# Patient Record
Sex: Female | Born: 1965 | Race: Black or African American | Hispanic: No | Smoking: Never smoker
Health system: Southern US, Community
[De-identification: ages and names within clinical notes are randomized; demographics above are authoritative.]

## PROBLEM LIST (undated history)

## (undated) DIAGNOSIS — Z8739 Personal history of other diseases of the musculoskeletal system and connective tissue: Secondary | ICD-10-CM

## (undated) HISTORY — PX: BACK SURGERY: SHX140

## (undated) HISTORY — PX: ABDOMINAL HYSTERECTOMY: SHX81

---

## 2002-09-04 ENCOUNTER — Emergency Department (HOSPITAL_COMMUNITY): Admission: EM | Admit: 2002-09-04 | Discharge: 2002-09-04 | Payer: Self-pay | Admitting: Emergency Medicine

## 2002-09-04 ENCOUNTER — Encounter: Payer: Self-pay | Admitting: Emergency Medicine

## 2002-11-02 ENCOUNTER — Emergency Department (HOSPITAL_COMMUNITY): Admission: EM | Admit: 2002-11-02 | Discharge: 2002-11-02 | Payer: Self-pay | Admitting: Emergency Medicine

## 2003-02-12 ENCOUNTER — Observation Stay (HOSPITAL_COMMUNITY): Admission: RE | Admit: 2003-02-12 | Discharge: 2003-02-13 | Payer: Self-pay | Admitting: Specialist

## 2003-02-12 ENCOUNTER — Encounter: Payer: Self-pay | Admitting: Specialist

## 2003-11-11 ENCOUNTER — Other Ambulatory Visit: Admission: RE | Admit: 2003-11-11 | Discharge: 2003-11-11 | Payer: Self-pay | Admitting: Obstetrics and Gynecology

## 2004-02-27 ENCOUNTER — Encounter (INDEPENDENT_AMBULATORY_CARE_PROVIDER_SITE_OTHER): Payer: Self-pay | Admitting: Specialist

## 2004-02-27 ENCOUNTER — Inpatient Hospital Stay (HOSPITAL_COMMUNITY): Admission: RE | Admit: 2004-02-27 | Discharge: 2004-02-29 | Payer: Self-pay | Admitting: Obstetrics and Gynecology

## 2004-08-11 ENCOUNTER — Encounter: Admission: RE | Admit: 2004-08-11 | Discharge: 2004-08-11 | Payer: Self-pay | Admitting: Specialist

## 2005-09-03 ENCOUNTER — Emergency Department (HOSPITAL_COMMUNITY): Admission: EM | Admit: 2005-09-03 | Discharge: 2005-09-03 | Payer: Self-pay | Admitting: Emergency Medicine

## 2005-09-05 IMAGING — CT CT L SPINE W/ CM
3 of 10 series · 11 of 33 positions shown, 13 images · non-contrast
Comparison: none

CLINICAL DATA: Back and right leg pain.  Clinically L5 radiculopathy, left.

[Series 4: recon 3: l-spine helical · axial · 0.27mm/px · z∈[-41,+51]mm · 3 of 149 slices shown, 4 images]
[im 38/149  soft-tissue]
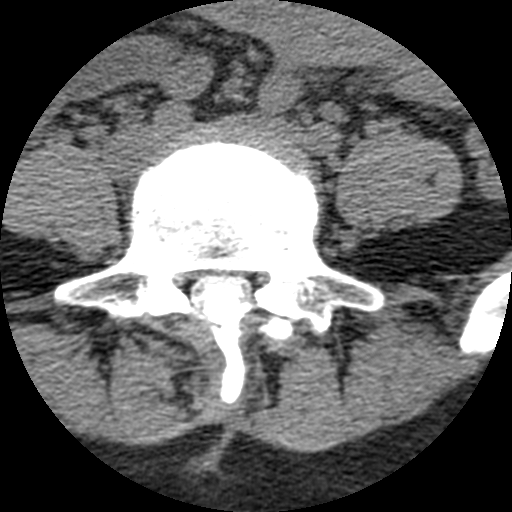
[im 38/149  bone]
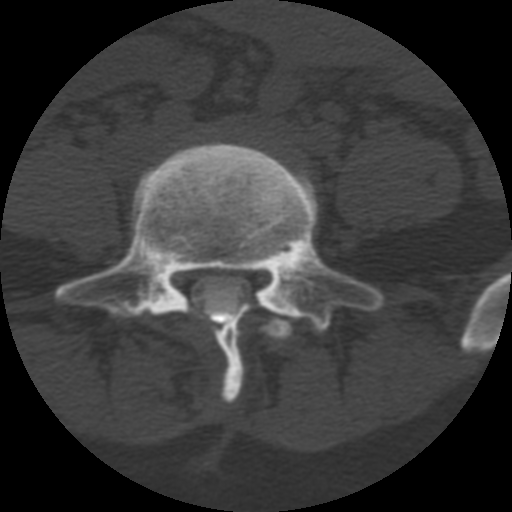
[im 75/149  bone]
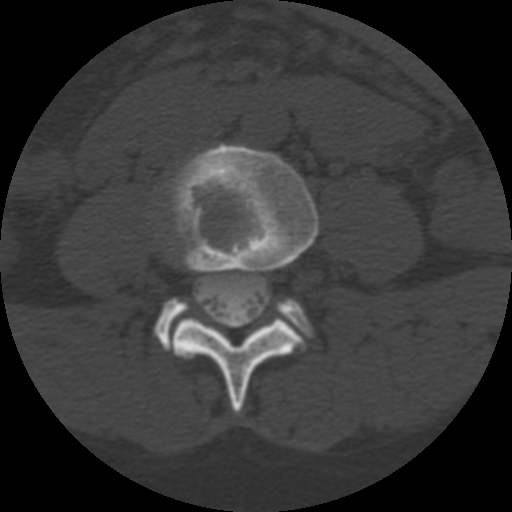
[im 112/149  bone]
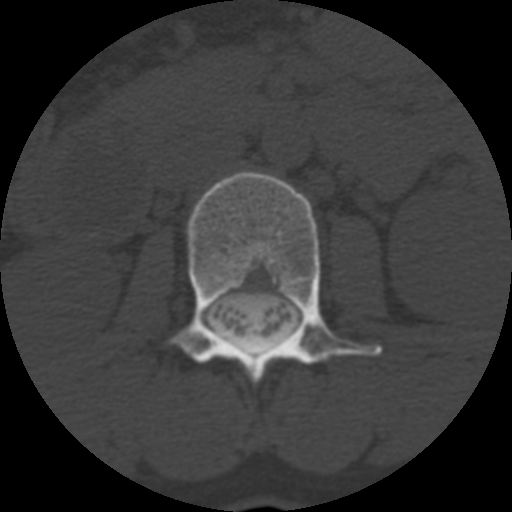

[Series 400: reformatted · sagittal · 0.37mm/px · 5 of 40 slices shown, 6 images (1 of 2)]
[im 14/40  bone]
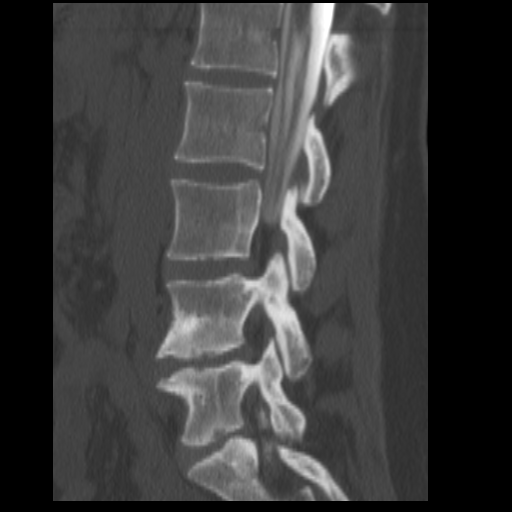
[im 17/40  bone]
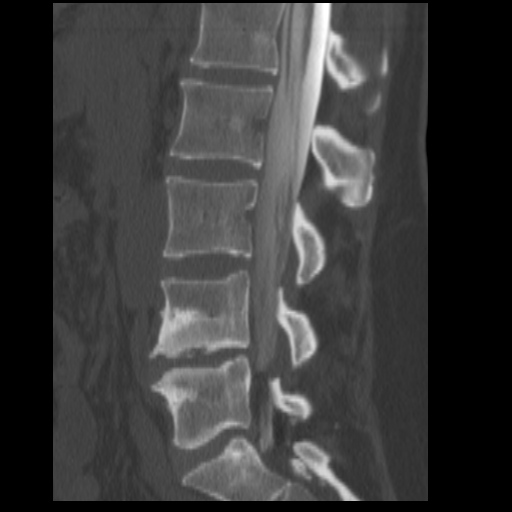
[im 20/40  soft-tissue]
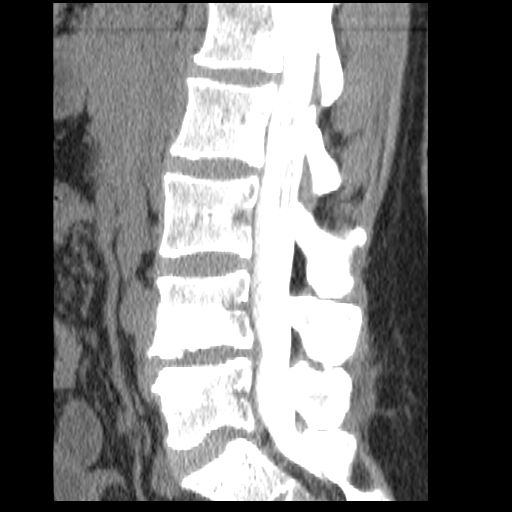
[im 20/40  bone]
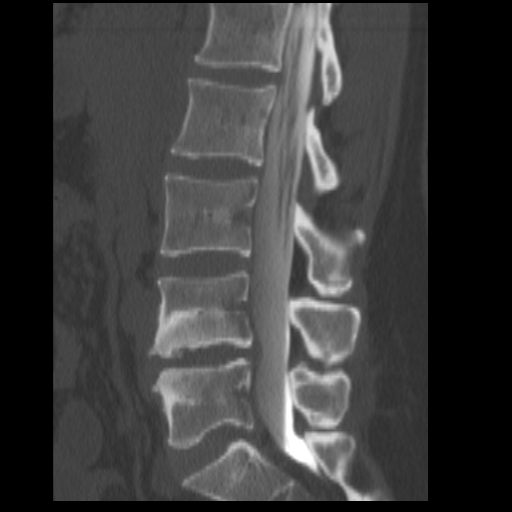
[im 23/40  bone]
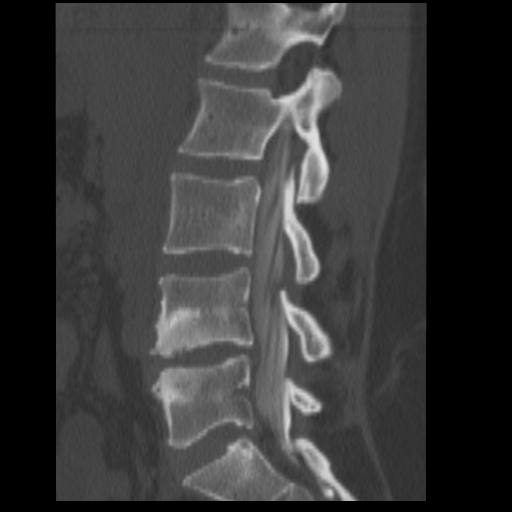
[im 27/40  bone]
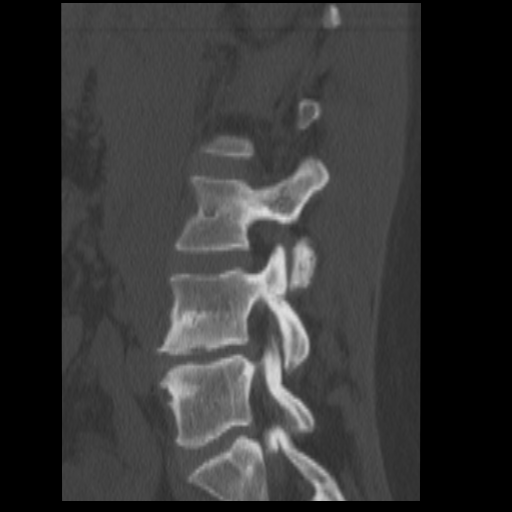

[Series 401: reformatted · coronal · 0.37mm/px · 3 of 40 slices shown (2 of 2)]
[im 8/40  bone]
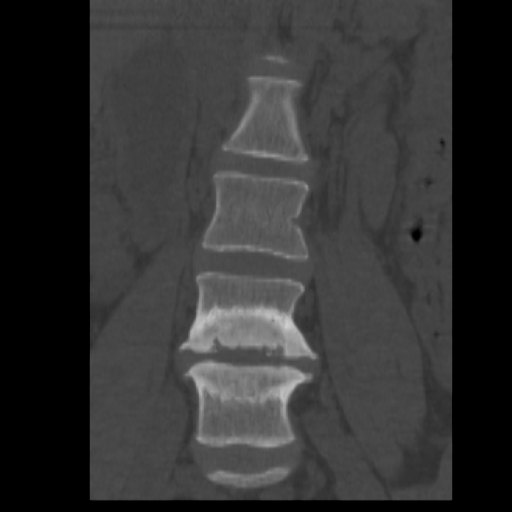
[im 16/40  bone]
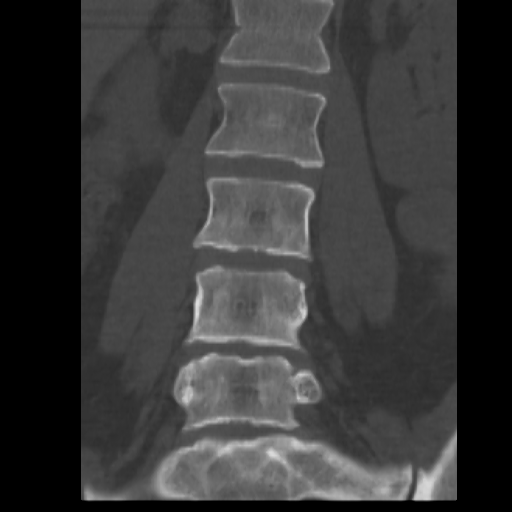
[im 24/40  bone]
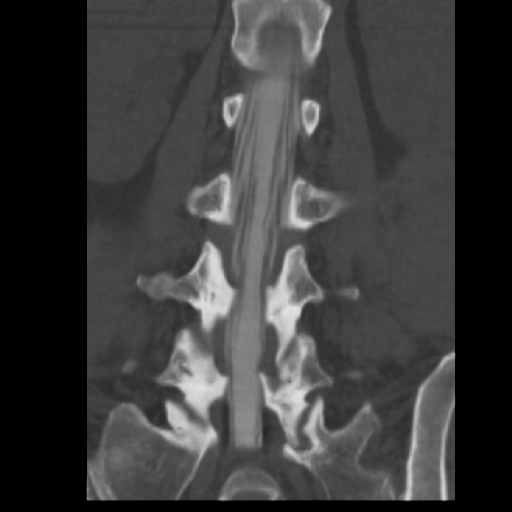

[11 of 33 positions shown; findings below may reference images not displayed]

LUMBAR MYELOGRAM:
Following informed consent, sterile preparation of the back, and adequate local anesthesia, a lumbar puncture was performed using a 22 gauge spinal needle at 
L3-4 from a left paramedian approach.  Fluid was clear and colorless.  15 cc of Omnipaque 180 was instilled in the subarachnoid space.  AP, lateral, and oblique views demonstrate a fairly prominent extradural defect at L4-5 ventrally consistent with a soft disk protrusion.  There is marked disk space narrowing at L4-5 with endplate reactive changes.  Mild disk space narrowing is seen at L5-S1.  There is no nerve root cut off or spinal stenosis.  Flexion/extension show no abnormal movement.  Side-to-side bending showed no L5 nerve root encroachment in the lateral recess.
IMPRESSION: Prominent ventral defect L4-5 consistent with a disk protrusion.
POST MYELOGRAM CT:
The lower five disk spaces were examined:
L1-2:  Normal interspace. 
L2-3:  Normal interspace. 
L3-4:  Mild facet arthropathy.  No stenosis or disk protrusion.
L4-5:  Post surgical changes status post hemilaminotomy, left.  Soft central disk protrusion.  Mild facet arthropathy.  No definite L5 nerve root encroachment.
L5-S1:  Small protrusion.  No root cut off or spinal stenosis.
IMPRESSION: 1.  The dominant finding is a soft disk protrusion centrally at L4-5 associated with disk space narrowing and endplate reactive changes.  There is, however, no significant L5 nerve root encroachment.
2.  Small central protrusion L5-S1 without neural encroachment.
CT MULTIPLANAR RECONSTRUCTION:
Multiplanar reformatted CT images were reconstructed from the axial CT data set. These images were reviewed and pertinent findings are included in the accompanying complete CT report. 

IMPRESSION

See complete CT report.

## 2005-12-12 ENCOUNTER — Ambulatory Visit: Payer: Self-pay | Admitting: Family Medicine

## 2006-01-25 ENCOUNTER — Ambulatory Visit: Payer: Self-pay | Admitting: Family Medicine

## 2006-06-21 ENCOUNTER — Ambulatory Visit: Payer: Self-pay | Admitting: Internal Medicine

## 2006-12-29 ENCOUNTER — Ambulatory Visit: Payer: Self-pay | Admitting: Internal Medicine

## 2007-05-29 ENCOUNTER — Telehealth: Payer: Self-pay | Admitting: Internal Medicine

## 2007-05-30 ENCOUNTER — Encounter (INDEPENDENT_AMBULATORY_CARE_PROVIDER_SITE_OTHER): Payer: Self-pay | Admitting: *Deleted

## 2007-05-31 ENCOUNTER — Telehealth (INDEPENDENT_AMBULATORY_CARE_PROVIDER_SITE_OTHER): Payer: Self-pay | Admitting: *Deleted

## 2007-07-17 DIAGNOSIS — Z9189 Other specified personal risk factors, not elsewhere classified: Secondary | ICD-10-CM | POA: Insufficient documentation

## 2007-07-17 DIAGNOSIS — F411 Generalized anxiety disorder: Secondary | ICD-10-CM | POA: Insufficient documentation

## 2007-07-26 ENCOUNTER — Encounter (INDEPENDENT_AMBULATORY_CARE_PROVIDER_SITE_OTHER): Payer: Self-pay | Admitting: *Deleted

## 2007-08-03 ENCOUNTER — Ambulatory Visit: Payer: Self-pay | Admitting: Internal Medicine

## 2007-09-11 ENCOUNTER — Telehealth: Payer: Self-pay | Admitting: Internal Medicine

## 2007-09-12 ENCOUNTER — Telehealth: Payer: Self-pay | Admitting: Internal Medicine

## 2007-10-05 ENCOUNTER — Ambulatory Visit: Payer: Self-pay | Admitting: Internal Medicine

## 2007-10-19 ENCOUNTER — Ambulatory Visit: Payer: Self-pay | Admitting: Internal Medicine

## 2008-05-26 ENCOUNTER — Encounter (INDEPENDENT_AMBULATORY_CARE_PROVIDER_SITE_OTHER): Payer: Self-pay | Admitting: *Deleted

## 2008-05-26 ENCOUNTER — Telehealth (INDEPENDENT_AMBULATORY_CARE_PROVIDER_SITE_OTHER): Payer: Self-pay | Admitting: *Deleted

## 2008-05-26 ENCOUNTER — Ambulatory Visit: Payer: Self-pay | Admitting: Family Medicine

## 2008-05-30 ENCOUNTER — Telehealth (INDEPENDENT_AMBULATORY_CARE_PROVIDER_SITE_OTHER): Payer: Self-pay | Admitting: *Deleted

## 2008-06-06 ENCOUNTER — Encounter (INDEPENDENT_AMBULATORY_CARE_PROVIDER_SITE_OTHER): Payer: Self-pay | Admitting: *Deleted

## 2008-08-27 ENCOUNTER — Ambulatory Visit: Payer: Self-pay | Admitting: Internal Medicine

## 2008-08-29 ENCOUNTER — Encounter (INDEPENDENT_AMBULATORY_CARE_PROVIDER_SITE_OTHER): Payer: Self-pay | Admitting: *Deleted

## 2008-09-12 ENCOUNTER — Encounter: Admission: RE | Admit: 2008-09-12 | Discharge: 2008-09-12 | Payer: Self-pay | Admitting: Internal Medicine

## 2008-09-22 ENCOUNTER — Encounter (INDEPENDENT_AMBULATORY_CARE_PROVIDER_SITE_OTHER): Payer: Self-pay | Admitting: *Deleted

## 2008-09-29 ENCOUNTER — Encounter: Payer: Self-pay | Admitting: Gastroenterology

## 2009-01-21 ENCOUNTER — Telehealth (INDEPENDENT_AMBULATORY_CARE_PROVIDER_SITE_OTHER): Payer: Self-pay | Admitting: *Deleted

## 2009-03-06 ENCOUNTER — Encounter (INDEPENDENT_AMBULATORY_CARE_PROVIDER_SITE_OTHER): Payer: Self-pay | Admitting: *Deleted

## 2009-03-06 ENCOUNTER — Telehealth: Payer: Self-pay | Admitting: Internal Medicine

## 2009-03-09 ENCOUNTER — Encounter (INDEPENDENT_AMBULATORY_CARE_PROVIDER_SITE_OTHER): Payer: Self-pay | Admitting: *Deleted

## 2009-03-12 ENCOUNTER — Telehealth (INDEPENDENT_AMBULATORY_CARE_PROVIDER_SITE_OTHER): Payer: Self-pay | Admitting: *Deleted

## 2009-03-23 ENCOUNTER — Telehealth (INDEPENDENT_AMBULATORY_CARE_PROVIDER_SITE_OTHER): Payer: Self-pay | Admitting: *Deleted

## 2009-04-01 ENCOUNTER — Telehealth: Payer: Self-pay | Admitting: Internal Medicine

## 2009-04-22 ENCOUNTER — Encounter: Payer: Self-pay | Admitting: Internal Medicine

## 2011-02-11 NOTE — Op Note (Signed)
[name], [name]                           ACCOUNT NO.:  [id number]   MEDICAL RECORD NO.:  [id number]                   PATIENT TYPE:  AMB   LOCATION:  DAY                                  FACILITY:  [facility]   PHYSICIAN:  Jene Every, M.D.                 DATE OF BIRTH:  [birth year]   DATE OF PROCEDURE:  02/12/2003  DATE OF DISCHARGE:                                 OPERATIVE REPORT   PREOPERATIVE DIAGNOSIS:  Herniated nucleus pulposus, spinal stenosis L5-S1,  left.   POSTOPERATIVE DIAGNOSIS:  Herniated nucleus pulposus, spinal stenosis L5-S1,  left.   OPERATION PERFORMED:  Lateral recess decompression, microdiskectomy L5-S1.  Foraminotomy S1.  Decompression of the S1 nerve root.   SURGEON:  Jene Every, M.D.   ANESTHESIA:  General.   INDICATIONS FOR PROCEDURE:  The patient is a 45 year old with refractory S1  radicular pain, herniated nucleus pulposus compressing the S1 nerve root and  lateral recess facet hypertrophy.  Operative intervention was indicated for  decompression.  Risks and benefits were discussed including infection,  damage to neurovascular structures, CSF leakage, epidural fibrosis  __________.   DESCRIPTION OF PROCEDURE:  Placed in supine position.  After adequate  induction of general anesthesia, 1g of Kefzol, placed prone on the [place]  frame.  All bony prominences well padded.  Lumbar region was prepped and  draped in the usual sterile fashion.  A 22 gauge spinal needle was utilized  to localize the 5-1 interspace with x-ray.  Incision was made from the  spinous process of 5-1.  Subcutaneous tissue dissected.  Electrocautery  utilized to achieve hemostasis.  A second confirmatory radiograph was  obtained.  Dorsal lumbar fascia identified and bilateral skin incision,  paraspinous muscle again elevated from the lamina of 5-1.  McCullough  retractor placed.  Operating microscope was draped and brought into the  surgical field. The patient had  increased lumbosacral angle. Detached  ligamentum flavum from the caudate edge of 5 and cephalad edge of S1  utilizing the micro curette to perform a foraminotomy at S1.  There was a  hypertrophic spur on the lateral recess.  I was unable to mobilize the S1  nerve root medially.  Therefore decompressed the lateral recess with a 2 mm  K[place] and taking care not to injure the S1 nerve root.  Following this  decompression  and  lysis of the epidural venous plexus, we were able to  mobilize the nerve root gently medially exposing the focal HNP at 5-1.  Nerve root well protected, performed annulotomy and copious portion of disk  material removed from the disk space.  This was further mobilized with a  nerve hook.  Thorough diskectomy of all herniated portions was performed.  I  then placed a hockey stick in the foramen of 5 and S1 and found them to be  widely patent.  There was active excursion of the  nerve root at least 1 cm  medial to the pedicle.  There was no evidence of active bleeding or CSF  leakage.  Copiously irrigated the disk spaced with antibiotic irrigation.  Examined __________  thecal sac, no evidence of residual disk herniation.  I  placed 1 mL fentanyl in the interlaminar space, removed the McCullough  retractor, inspected the paraspinous muscle. No evidence of active bleeding.  At this time fascia was repaired with  #1 Vicryl figure-of-eight sutures,  subcutaneous tissue reapproximated with 2-0 Vicryl subcuticular sutures, and  skin reapproximated with 4-0 subcuticular Prolene.  The  wound was reinforced with Steri-Strips.  Sterile dressings applied.  Placed  supine on hospital bed. Extubated without difficulty.  Transported to  recovery room satisfactory condition.  The patient tolerated the procedure  well. No complications.                                               Jene Every, M.D.    Cordelia Pen  D:  02/12/2003  T:  02/12/2003  Job:  [id number]

## 2011-02-11 NOTE — Discharge Summary (Signed)
[name], [name]                         ACCOUNT NO.:  [id number]   MEDICAL RECORD NO.:  [id number]                   PATIENT TYPE:  INP   LOCATION:  9317                                 FACILITY:  [facility]   PHYSICIAN:  [name], M.D.            DATE OF BIRTH:  17-Jun-1966   DATE OF ADMISSION:  02/27/2004  DATE OF DISCHARGE:  02/29/2004                                 DISCHARGE SUMMARY   ADMISSION DIAGNOSES:  1. Menometrorrhagia.  2. Uterine fibroid.   DISCHARGE DIAGNOSIS:  1. Menometrorrhagia.  2. Uterine fibroids.   PROCEDURE:  Open laparoscopy, exploratory laparotomy, total abdominal  hysterectomy.   HISTORY OF PRESENT ILLNESS:  A 45 year old gravida 5 para 3-0-2-3 female  with symptomatic uterine fibroid admitted for laparoscopic-assisted vaginal  hysterectomy.   HOSPITAL COURSE:  The patient was admitted to [facility].  She was  taken to the operating room where she was placed in the dorsal lithotomy  position for a planned laparoscopic-assisted vaginal hysterectomy.  The  patient was positioned while awake due to her history of back problems.  Open laparoscopy was performed as initial part of the planned procedure.  However, due to the enlarged uterus and limited visibility in the pelvis the  decision was made to convert to a total abdominal hysterectomy.  Total  abdominal hysterectomy was performed without difficulty.  A 14- to 15-week  size uterus was noted with multiple fibroids, normal ovaries bilaterally,  surgically separated tubes were noted, normal appendix, normal liver edge.  Final pathology is pending.  The patient had an uncomplicated postoperative  course.  Her CBC on postoperative day #1 showed a hemoglobin 8.2, hematocrit  26.2, white count 8.4.  Her preoperative hemoglobin had been 9.9.  The  patient was tolerating a regular diet, remained afebrile throughout her  hospital course, and was deemed well to be discharged home on  postoperative  day #2.   DISPOSITION:  Home.   CONDITION:  Stable.   DISCHARGE MEDICATIONS:  1. Continue iron supplementation one p.o. b.i.d.  2. Motrin 800 mg one p.o. q.6-8h. p.r.n. pain #30, one refill.  3. Dilaudid 2 mg dispense #25 one to two tablets q.3-4h. p.r.n. pain.  4. Over-the-counter Colace 100 mg one p.o. b.i.d.  5. Over-the-counter simethicone.   Discharge instructions were given.  The patient will call with temperature  greater than or equal to 100.4.  Nothing per vagina for 4-6 weeks.  No heavy  lifting or driving for 2 weeks.  Call for increased incisional pain,  drainage, or redness from the incision site.  No straining with the bowel  movements.  Call if soaking a regular pad every hour or more frequently.  Follow-up appointment in 4-6 weeks; staple removal in the office on  Thursday.  [name], M.D.    [place]/MEDQ  D:  02/29/2004  T:  03/01/2004  Job:  [id number]

## 2011-02-11 NOTE — H&P (Signed)
NAME:  [name], [name]                         ACCOUNT NO.:  [id number]   MEDICAL RECORD NO.:  [id number]                   PATIENT TYPE:  OBV   LOCATION:  NA                                   FACILITY:  [facility]   PHYSICIAN:  Maxie Better, M.D.            DATE OF BIRTH:  [birth year]   DATE OF ADMISSION:  DATE OF DISCHARGE:                                HISTORY & PHYSICAL   CHIEF COMPLAINT:  Heavy menses and pelvic pain.   HISTORY OF PRESENT ILLNESS:  This is a 45 year old gravida 5, para 3-0-2-3,  single black female. Last menstrual period of January 19, 2004 with  symptomatic uterine fibroids who is now presenting for laparoscopic assisted  vaginal hysterectomy. The patient has had cycles every 28 days for 6 days  duration. She complains of lower abdominal cramping, which has been  increasingly worse over the past several months. The pain lasts about 3 to 4  days. History is notable for tubal ligation in the past. She has had  intramenstrual bleeding for the past previous 3 months. Evaluation included  ultrasound on November 11, 2003 that showed a uterus measuring 12.6 x 10 x 8  cm with multiple fibroids. The largest is in the mid portion of the uterus.  Left ovaries could not be seen at the time. A sonohysterogram was then  performed on December 24, 2003 that showed 4 hypoechoic masses consisting of  multiple submucosal fibroids. The patient also underwent an endometrial  biopsy on Feb 18, 2004 that showed benign secretory endometrium. The patient  declined alternative management and desired definitive management of her  fibroids. Her last CBC on Feb 18, 2004 showed a hemoglobin of 9.0,  hematocrit of 28.8. The patient is started on iron supplementation.   PAST MEDICAL HISTORY:  Iron deficiency anemia and anxiety disorder.   ALLERGIES:  No known drug allergies.   MEDICATIONS:  Ambien p.r.n. difficulty with sleep. Darvocet N-100 for  cramping and pain. U-iron.   PAST SURGICAL  HISTORY:  Spinal surgery in the back May of 2004. _________  x2. Tubal ligation.   GYNECOLOGIC HISTORY:  Vaginal delivery x3 with elective termination x2.   FAMILY HISTORY:  Father died of a myocardial infarction at age 76. No colon,  ovarian or breast cancer. Sister with thyroid disorder.   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 Three children,  involved.   REVIEW OF SYSTEMS:  GENITOURINARY:  See HPI. NEUROLOGIC:  Anxiety and  difficulty with sleeping. PELVIC:  Pelvic pain. Prior treatment for  Trichomonas vaginitis.   PHYSICAL EXAMINATION:  GENERAL:  A well developed, well nourished black  female. No acute distress.  VITAL SIGNS:  Blood pressure 120/68. Weight 196.2 pounds.  SKIN:  No lesions.  HEENT:  Pale, pink conjunctiva. Oropharynx negative. Anicteric sclera.  HEART:  Regular rate and rhythm. Without murmur.  NECK:  Supple.  NODES:  No axillary or supraclavicular  nodes palpable.  ABDOMEN:  Soft, nontender.  BACK:  No CVA tenderness. Low back scar noted.  BREAST:  Soft, nontender. No palpable mass. Nipples without discharge.  PELVIC:  Examination showed vulva with no lesions. Vagina had no discharge.  Cervix was parous. Uterus was irregular, anteverted about 10 to 12 week  size. Adnexa not palpable secondary to uterine fibroids.  RECTAL:  Examination deferred.  EXTREMITIES:  No calf tenderness or edema.   IMPRESSION:  Menometrorrhagia, multiple submucosal fibroids.   PLAN:  Admission. Laparoscopic assisted vaginal hysterectomy. Antibiotic  prophylaxis. Antiembolic stockings. Risks of procedure was reviewed with the  patient, including but not limited to, infection, bleeding, injury to  surrounding organ structures, possible need for surgery secondary to ovarian  disease, internal _________ that may lead to bowel obstruction, pelvic pain,  fistula formation. The possibility for blood transfusion discussed. The risk  of transfusion including HIV  transmission, hepatitis transmission, acute  infection discussed. Inability to complete the procedure vaginally was  discussed. Postop care criteria for discharge reviewed. All questions  answered.                                               Maxie Better, M.D.    [place]/MEDQ  D:  02/26/2004  T:  02/27/2004  Job:  [id number]

## 2011-02-11 NOTE — Op Note (Signed)
[name], [name]                         ACCOUNT NO.:  [id number]   MEDICAL RECORD NO.:  [id number]                   PATIENT TYPE:  INP   LOCATION:  9317                                 FACILITY:  [facility]   PHYSICIAN:  Maxie Better, M.D.            DATE OF BIRTH:  03/02/1966   DATE OF PROCEDURE:  02/27/2004  DATE OF DISCHARGE:                                 OPERATIVE REPORT   PREOPERATIVE DIAGNOSES:  Menometrorrhagia, uterine fibroids.   POSTOPERATIVE DIAGNOSES:  Menometrorrhagia, uterine fibroids.   PROCEDURE:  Open laparoscopy, exploratory laparotomy, total abdominal  hysterectomy.   ANESTHESIA:  General.   SURGEON:  Maxie Better, M.D.   ASSISTANT:  Cordelia Pen A. Rosalio Macadamia, M.D.   INDICATIONS FOR PROCEDURE:  This is a 45 year old, gravida 5, para 3 female  with symptomatic uterine fibroid who now presents for surgical management.  Please see the dictated history and physical exam for specific details. The  risks and benefits of the procedure planned was explained to the patient,  consent was signed, the patient was transferred to the operating room. The  patient was scheduled for a laparoscopically assisted vaginal hysterectomy.   DESCRIPTION OF PROCEDURE:  Under adequate general anesthesia, the patient  was placed in the dorsal lithotomy position. Due to her history of back pain  in the past, the patient was positioned while she was awake.  Examination  under anesthesia revealed an irregular enlarged uterus about 12-13 weeks  size. The patient was then sterilely prepped and draped in the usual  fashion. An indwelling Foley catheter was sterilely placed, the vagina which  had been prepped had a bivalve speculum placed within it, single tooth  tenaculum placed on the anterior lip of the cervix, an acorn cannula was  introduced into the cervical os and attached to the tenaculum for  manipulation of the uterus, the bivalve speculum was removed. Attention was  then  turned to the abdomen. The patient has a previous infraumbilical scar  secondary to prior tubal ligation.  A 0.25% Marcaine was injected along the  previous scar and an infraumbilical incision was then made and carried down  to rectus fascia which was incised transversely.  The parietoperitoneum was  subsequently opened bluntly. A pursestring suture of the rectus fascia was  then performed and a Hasson cannula was introduced into the abdomen without  incident.  A lighted video laparoscope was then introduced through the port,  flow of carbon dioxide was then done. The patient was placed in  Trendelenburg and the pelvis is inspected. The uterus was noted to be  enlarged with multiple fibroids, the left round ligament was seen with some  adhesions. The left tube showed evidence of previous surgical separation and  the ovary was otherwise normal. There was a right fundal fibroid that was  noted, limited the ability to fully see the right round ligament and the  right ovary was normal. Interruption of the right  tube surgically was noted  consistent with previous tubal ligation history. The appendix was noted to  be normal, the upper abdomen showed a normal liver edge.  On evaluation of  the enlarged uterus, a decision was made to place a second incision  suprapubically and the 5 mm port was placed under direct visualization.  Using a probe as well as the atraumatic grasper, an attempt was made for [place]  to assess the likelihood of laparoscopic assisted procedure being able to be  performed.  The right round ligament was grasped and the right uteroovarian  ligament was not easily seen well for possible application of the tripolar  cautery. In addition, the bladder reflection could not be seen well due to  an anterior fibroid present. The Hasson cannula was noted to be quite up  close to the fundus of the uterus and this was backed up in an attempt to  further facilitate the surgery at which time  carbon dioxide was leaking from  the infraumbilical site. With all these factors in mind, a decision was then  made to convert a laparotomy at which time the Hasson cannula was removed,  the suprapubic port was removed. The pursestring fascial closure was then  tied infraumbilically and attention was then turned back to the vagina where  all vaginal instruments were then removed.  Marcaine 0.25% was injected  along the planned Pfannenstiel skin incision. The Pfannenstiel skin incision  is made and incorporating the suprapubic site incision. The Pfannenstiel  skin incision was then carried down to the rectus fascia, rectus fascia  incised in the midline and extended bilaterally. The rectus fascia was then  bluntly and sharply dissected off the rectus muscles superiorly and  inferiorly. The rectus muscles split in the midline and parietoperitoneum  was entered sharply and extended superiorly and inferiorly.  On closer  inspection, the uterus was noted to be about 14 week size, irregular with an  anterior lower uterine segment fibroid and a posterior large fibroid and  multiple other fibroids probable.  The ovaries are noted to be normal  bilateral, the tubes showed surgical separation with __________ tubal  adhesions.  The hysterectomy was then performed in the following fashion:  A  self retaining Balfour retractor was then placed, a bladder blade was then  used to displace the bladder. The bowels were packed upwardly, traction was  placed on the uteroovarian ligaments  bilaterally using long Kelly's.  The  round ligaments were then isolated, suture ligated with #0 Vicryl x2 and the  intervening segment was opened using cautery. The anterior leaf of the broad  ligament was then partially opened, the posterior leaf of the broad ligament  then bluntly entered and the uteroovarian ligaments were doubly clamped bilaterally, cut, free tied with #0 Vicryl and suture ligated with #0 Vicryl  suture.   At that point, the uterus was then exteriorized, the uterine  vessels were then skeletonized, cut, suture ligated with #0 Vicryl. The  anterior leaf of the broad ligaments were then further opened to create a  bladder flap. The bladder was then sharply dissected off the lower uterine  segment and displaced inferiorly with the uterine vessels being bilaterally  skeletonized. The uterine vessels were then bilaterally doubly clamped, cut  and suture ligated with #0 Vicryl x2.  The cervix was palpated and noted to  be not elongated. The body of the uterus was then dissected off its cervical  attachment. The cervix was then placed on traction, the  cardinal ligaments  was bilaterally clamped, cut and suture ligated with #0 Vicryl. The  uterosacral ligament was bilaterally clamped, cut and suture ligated with #0  Vicryl.  The remaining attachment of the cervix to the vaginal lateral  aspect of the pelvis was then serially bilaterally clamped, cut and suture  ligated to the cervicovaginal junction within reach at which time the  cervicovaginal junction was then bilaterally clamped with curved Heaney  sutures. The cervix was then severed from its vaginal attachment.  The  vaginal angles were then suture ligated with #0 Vicryl suture bilaterally.  The remaining opening portion of the vagina was then closed with  circumferential #0 Vicryl running locked stitch and the vaginal cuff was  then closed in a vertical fashion using #0 Vicryl figure-of-eight sutures.  The bladder had been continually displaced inferiorly throughout the case.  Small bleeding along the bladder peritoneum was then cauterized and small  bleeding on the right pedicle which was then gently brought up and figure-of-  eight #0 Vicryl x2 was then used to secure hemostasis.  With good hemostasis  then noted at the vaginal cuff, the pelvis was then irrigated and suctioned.  Inspection of the other pedicle was noted to reveal a small  bleeder where  the right tube and ovary pedicle was and this was clamped and suture ligated  with 2-0 Vicryl suture.  The ureters were noted to be bilaterally  peristalsing at the end of the case with good hemostasis noted throughout  the field. The packings were removed, the self retaining retractor was  removed and the undersurface of the rectus fascia was inspected, small  bleeders cauterized, the rectus fascia was closed with #0 Vicryl x2.  The  subcutaneous area was irrigated, suctioned of debris and small bleeders  cauterized. The skin was approximated using Ethicon staples. The specimen  was uterus and cervix sent to pathology.  Estimated blood loss was 150 mL.  Urine output was about 200 mL clear yellow urine. Intraoperative fluids was  about 2200 mL crystalloid. Sponge and instrument counts x2 is correct.  Complications was none. The patient tolerated the procedure well and was transferred to the recovery room in stable condition.                                               Maxie Better, M.D.    [place]/MEDQ  D:  02/27/2004  T:  02/28/2004  Job:  [id number]

## 2011-03-14 ENCOUNTER — Emergency Department (HOSPITAL_COMMUNITY)
Admission: EM | Admit: 2011-03-14 | Discharge: 2011-03-14 | Disposition: A | Discharge status: Home / Self Care | Payer: BC Managed Care – PPO | Attending: Emergency Medicine | Admitting: Emergency Medicine

## 2011-03-14 DIAGNOSIS — R112 Nausea with vomiting, unspecified: Secondary | ICD-10-CM | POA: Insufficient documentation

## 2011-03-14 DIAGNOSIS — R51 Headache: Secondary | ICD-10-CM | POA: Insufficient documentation

## 2011-03-14 DIAGNOSIS — R5381 Other malaise: Secondary | ICD-10-CM | POA: Insufficient documentation

## 2011-03-14 DIAGNOSIS — F411 Generalized anxiety disorder: Secondary | ICD-10-CM | POA: Insufficient documentation

## 2011-03-14 DIAGNOSIS — N390 Urinary tract infection, site not specified: Secondary | ICD-10-CM | POA: Insufficient documentation

## 2011-03-14 LAB — URINALYSIS, ROUTINE W REFLEX MICROSCOPIC
Glucose, UA: NEGATIVE mg/dL
Hgb urine dipstick: NEGATIVE
Ketones, ur: NEGATIVE mg/dL
Nitrite: NEGATIVE
Protein, ur: NEGATIVE mg/dL
Specific Gravity, Urine: 1.034 — ABNORMAL HIGH (ref 1.005–1.030)
Urobilinogen, UA: 1 mg/dL (ref 0.0–1.0)
pH: 6 (ref 5.0–8.0)

## 2011-03-14 LAB — URINE MICROSCOPIC-ADD ON

## 2011-03-14 LAB — PREGNANCY, URINE: Preg Test, Ur: NEGATIVE

## 2011-03-14 LAB — POCT I-STAT, CHEM 8
BUN: 15 mg/dL (ref 6–23)
Calcium, Ion: 1.14 mmol/L (ref 1.12–1.32)
Chloride: 102 meq/L (ref 96–112)
Creatinine, Ser: 0.8 mg/dL (ref 0.50–1.10)
Glucose, Bld: 100 mg/dL — ABNORMAL HIGH (ref 70–99)
HCT: 45 % (ref 36.0–46.0)
Hemoglobin: 15.3 g/dL — ABNORMAL HIGH (ref 12.0–15.0)
Potassium: 3.8 mEq/L (ref 3.5–5.1)
Sodium: 138 mEq/L (ref 135–145)
TCO2: 24 mmol/L (ref 0–100)

## 2011-03-24 ENCOUNTER — Other Ambulatory Visit: Payer: Self-pay | Admitting: Family Medicine

## 2011-03-24 DIAGNOSIS — Z1231 Encounter for screening mammogram for malignant neoplasm of breast: Secondary | ICD-10-CM

## 2011-03-28 ENCOUNTER — Other Ambulatory Visit: Payer: Self-pay | Admitting: Family Medicine

## 2011-03-29 ENCOUNTER — Other Ambulatory Visit: Payer: BC Managed Care – PPO

## 2011-03-31 ENCOUNTER — Ambulatory Visit: Payer: BC Managed Care – PPO

## 2011-04-14 ENCOUNTER — Ambulatory Visit: Payer: BC Managed Care – PPO

## 2011-04-18 ENCOUNTER — Inpatient Hospital Stay: Admission: RE | Admit: 2011-04-18 | Payer: BC Managed Care – PPO | Source: Ambulatory Visit

## 2011-04-18 ENCOUNTER — Ambulatory Visit: Payer: BC Managed Care – PPO

## 2011-04-21 ENCOUNTER — Ambulatory Visit
Admission: RE | Admit: 2011-04-21 | Discharge: 2011-04-21 | Disposition: A | Discharge status: Home / Self Care | Payer: BC Managed Care – PPO | Source: Ambulatory Visit | Attending: Family Medicine | Admitting: Family Medicine

## 2011-04-28 ENCOUNTER — Other Ambulatory Visit: Payer: BC Managed Care – PPO

## 2011-05-12 ENCOUNTER — Inpatient Hospital Stay: Admission: RE | Admit: 2011-05-12 | Payer: BC Managed Care – PPO | Source: Ambulatory Visit

## 2011-05-19 ENCOUNTER — Inpatient Hospital Stay: Admission: RE | Admit: 2011-05-19 | Payer: BC Managed Care – PPO | Source: Ambulatory Visit

## 2011-05-26 ENCOUNTER — Other Ambulatory Visit: Payer: Self-pay | Admitting: Family Medicine

## 2011-05-26 ENCOUNTER — Ambulatory Visit: Admission: RE | Admit: 2011-05-26 | Payer: BC Managed Care – PPO | Source: Ambulatory Visit

## 2011-05-26 ENCOUNTER — Ambulatory Visit
Admission: RE | Admit: 2011-05-26 | Discharge: 2011-05-26 | Disposition: A | Discharge status: Home / Self Care | Payer: BC Managed Care – PPO | Source: Ambulatory Visit | Attending: Family Medicine | Admitting: Family Medicine

## 2012-01-06 ENCOUNTER — Encounter (HOSPITAL_COMMUNITY): Payer: Self-pay | Admitting: Emergency Medicine

## 2012-01-06 ENCOUNTER — Emergency Department (HOSPITAL_COMMUNITY)
Admission: EM | Admit: 2012-01-06 | Discharge: 2012-01-06 | Disposition: A | Discharge status: Home / Self Care | Payer: BC Managed Care – PPO | Attending: Emergency Medicine | Admitting: Emergency Medicine

## 2012-01-06 DIAGNOSIS — M549 Dorsalgia, unspecified: Secondary | ICD-10-CM | POA: Insufficient documentation

## 2012-01-06 DIAGNOSIS — M545 Low back pain, unspecified: Secondary | ICD-10-CM | POA: Insufficient documentation

## 2012-01-06 LAB — URINE MICROSCOPIC-ADD ON

## 2012-01-06 LAB — URINALYSIS, ROUTINE W REFLEX MICROSCOPIC
Glucose, UA: NEGATIVE mg/dL
Protein, ur: NEGATIVE mg/dL
pH: 6.5 (ref 5.0–8.0)

## 2012-01-06 MED ORDER — OXYCODONE-ACETAMINOPHEN 5-325 MG PO TABS
2.0000 | ORAL_TABLET | Freq: Once | ORAL | Status: AC
  Administered 2012-01-06: 2 via ORAL
  Filled 2012-01-06: qty 2

## 2012-01-06 MED ORDER — ONDANSETRON HCL 4 MG PO TABS: 4.0000 mg | ORAL_TABLET | Freq: Four times a day (QID) | ORAL | Status: AC

## 2012-01-06 MED ORDER — NAPROXEN 500 MG PO TABS: 500.0000 mg | ORAL_TABLET | Freq: Two times a day (BID) | ORAL | Status: AC

## 2012-01-06 MED ORDER — DIAZEPAM 5 MG PO TABS
10.0000 mg | ORAL_TABLET | Freq: Once | ORAL | Status: AC
  Administered 2012-01-06: 10 mg via ORAL
  Filled 2012-01-06: qty 2

## 2012-01-06 MED ORDER — HYDROMORPHONE HCL PF 1 MG/ML IJ SOLN
1.0000 mg | Freq: Once | INTRAMUSCULAR | Status: AC
  Administered 2012-01-06: 1 mg via INTRAMUSCULAR
  Filled 2012-01-06: qty 1

## 2012-01-06 MED ORDER — ONDANSETRON 4 MG PO TBDP
4.0000 mg | ORAL_TABLET | Freq: Once | ORAL | Status: AC
  Administered 2012-01-06: 4 mg via ORAL
  Filled 2012-01-06: qty 1

## 2012-01-06 MED ORDER — OXYCODONE-ACETAMINOPHEN 5-325 MG PO TABS: ORAL_TABLET | ORAL | Status: AC

## 2012-01-06 NOTE — ED Provider Notes (Signed)
History     CSN: [id number]  Arrival date & time 01/06/12  1410   First MD Initiated Contact with Patient 01/06/12 1603      Chief Complaint  Patient presents with  . Back Pain    has chronic pain states that on wed   . Abdominal Pain    pain to abd radates to bil legs     (Consider location/radiation/quality/duration/timing/severity/associated sxs/prior treatment) HPI  Patient who states she has a history of chronic lower back pain due to degenerative disc disease and history of herniated disc who is followed by Dr. Shelle Iron at [facility] orthopedics presents to the emergency department complaining of a three-day history of gradual onset lower black pain with persistent and worsening pain. Patient states she has history of  Intermittent "flares" of back pain that she treats at home with Tylenol and ibuprofen with pain usually resolving within 24-48 hours however she states that since Wednesday her lower back pain has gradually been worsening. Patient states pain is aggravated by both sitting and standing stating that she's been laying around the majority of the last 2 days due to increased pain. Patient states pain radiates down bilateral legs and also around to the lower abdomen however denies abdominal pain in general. Patient denies fevers, chills, chest pain, shortness of breath, nausea, vomiting, diarrhea, dysuria, hematuria, blood in her stool, lower extermity numbness/tingling/weakness, saddle seat paresthesias, loss of bowel or bladder function. Patient states pain feels similar to her history of recurrent lower back pain however more persistent and slightly more severe. Patient states she has an appointment to see Dr. Shelle Iron in 2 weeks.  History reviewed. No pertinent past medical history.  Past Surgical History  Procedure Date  . Back surgery   . Abdominal hysterectomy     No family history on file.  History  Substance Use Topics  . Smoking status: Not on file  . Smokeless  tobacco: Not on file  . Alcohol Use:     OB History    Grav Para Term Preterm Abortions TAB SAB Ect Mult Living                  Review of Systems  All other systems reviewed and are negative.    Allergies  Codeine and Vicodin  Home Medications   Current Outpatient Rx  Name Route Sig Dispense Refill  . IBUPROFEN 200 MG PO TABS Oral Take 600 mg by mouth every 6 (six) hours as needed. pain      Pulse 60  Temp 98.2 F (36.8 C)  SpO2 100%  Physical Exam  Nursing note and vitals reviewed. Constitutional: She is oriented to person, place, and time. She appears well-developed and well-nourished. No distress.  HENT:  Head: Normocephalic and atraumatic.  Eyes: Conjunctivae are normal.  Neck: Normal range of motion. Neck supple.  Cardiovascular: Normal rate, regular rhythm, normal heart sounds and intact distal pulses.  Exam reveals no gallop and no friction rub.   No murmur heard. Pulmonary/Chest: Effort normal and breath sounds normal. No respiratory distress. She has no wheezes. She has no rales. She exhibits no tenderness.  Abdominal: Soft. Bowel sounds are normal. She exhibits no distension and no mass. There is no tenderness. There is no rebound and no guarding.  Musculoskeletal: Normal range of motion. She exhibits no edema and no tenderness.       Negative straight leg raise. TTP of entire lower back without skin changes or crepitus. 5 out of 5 strength of  bilateral lower extremities with good femoral pulses  Neurological: She is alert and oriented to person, place, and time.  Skin: Skin is warm and dry. No rash noted. She is not diaphoretic. No erythema.  Psychiatric: She has a normal mood and affect.    ED Course  Procedures (including critical care time)  Intramuscular Dilaudid, ODT Zofran, by mouth Valium.  Patient states pain is much improving  Patient is now standing with improved back pain.    Labs Reviewed  URINALYSIS, ROUTINE W REFLEX MICROSCOPIC -  Abnormal; Notable for the following:    Leukocytes, UA SMALL (*)    All other components within normal limits  PREGNANCY, URINE  POCT PREGNANCY, URINE  URINE MICROSCOPIC-ADD ON   No results found.   1. Lower back pain       MDM  Acute on chronic back pain with no signs or symptoms of central cord compression or cauda equina. Patient states her pain is much improvement. Patient states she has a followup appointment with her back surgeon, Dr. Shelle Iron, in the next one to 2 weeks. Bilateral upper and lower extremities are neurovascularly intact. Patient's abdomen is soft and nontender.        [place], [place] 01/06/12 [phone]

## 2012-01-06 NOTE — Discharge Instructions (Signed)
Take Naprosyn for inflammation and pain with Percocet for breakthrough pain. Do not drive or operate machinery with Percocet use. Zofran is for nausea. It is very important to followup with Dr. Shelle Iron in the next 1 to 2 weeks for further evaluation and management of your back pain however return to emergency department for any changing or worsening symptoms as discussed.  Back Pain, Adult Low back pain is very common. About 1 in 5 people have back pain.The cause of low back pain is rarely dangerous. The pain often gets better over time.About half of people with a sudden onset of back pain feel better in just 2 weeks. About 8 in 10 people feel better by 6 weeks.  CAUSES Some common causes of back pain include:  Strain of the muscles or ligaments supporting the spine.   Wear and tear (degeneration) of the spinal discs.   Arthritis.   Direct injury to the back.  DIAGNOSIS Most of the time, the direct cause of low back pain is not known.However, back pain can be treated effectively even when the exact cause of the pain is unknown.Answering your caregiver's questions about your overall health and symptoms is one of the most accurate ways to make sure the cause of your pain is not dangerous. If your caregiver needs more information, he or she may order lab work or imaging tests (X-rays or MRIs).However, even if imaging tests show changes in your back, this usually does not require surgery. HOME CARE INSTRUCTIONS For many people, back pain returns.Since low back pain is rarely dangerous, it is often a condition that people can learn to [facility] their own.   Remain active. It is stressful on the back to sit or stand in one place. Do not sit, drive, or stand in one place for more than 30 minutes at a time. Take short walks on level surfaces as soon as pain allows.Try to increase the length of time you walk each day.   Do not stay in bed.Resting more than 1 or 2 days can delay your recovery.    Do not avoid exercise or work.Your body is made to move.It is not dangerous to be active, even though your back may hurt.Your back will likely heal faster if you return to being active before your pain is gone.   Pay attention to your body when you bend and lift. Many people have less discomfortwhen lifting if they bend their knees, keep the load close to their bodies,and avoid twisting. Often, the most comfortable positions are those that put less stress on your recovering back.   Find a comfortable position to sleep. Use a firm mattress and lie on your side with your knees slightly bent. If you lie on your back, put a pillow under your knees.   Only take over-the-counter or prescription medicines as directed by your caregiver. Over-the-counter medicines to reduce pain and inflammation are often the most helpful.Your caregiver may prescribe muscle relaxant drugs.These medicines help dull your pain so you can more quickly return to your normal activities and healthy exercise.   Put ice on the injured area.   Put ice in a plastic bag.   Place a towel between your skin and the bag.   Leave the ice on for 15 to 20 minutes, 3 to 4 times a day for the first 2 to 3 days. After that, ice and heat may be alternated to reduce pain and spasms.   Ask your caregiver about trying back exercises and gentle  massage. This may be of some benefit.   Avoid feeling anxious or stressed.Stress increases muscle tension and can worsen back pain.It is important to recognize when you are anxious or stressed and learn ways to manage it.Exercise is a great option.  SEEK MEDICAL CARE IF:  You have pain that is not relieved with rest or medicine.   You have pain that does not improve in 1 week.   You have new symptoms.   You are generally not feeling well.  SEEK IMMEDIATE MEDICAL CARE IF:   You have pain that radiates from your back into your legs.   You develop new bowel or bladder control  problems.   You have unusual weakness or numbness in your arms or legs.   You develop nausea or vomiting.   You develop abdominal pain.   You feel faint.  Document Released: 09/12/2005 Document Revised: 09/01/2011 Document Reviewed: 01/31/2011 [facility] Patient Information 2012 [place], [place].

## 2012-01-08 NOTE — ED Provider Notes (Signed)
Medical screening examination/treatment/procedure(s) were performed by non-physician practitioner and as supervising physician I was immediately available for consultation/collaboration.   Suzi Roots, MD 01/08/12 [phone]

## 2012-06-19 IMAGING — CT CT HEAD W/O CM
2 series · 15 of 30 positions shown, 19 images · non-contrast
Comparison: None.

CLINICAL DATA: Generalized headaches.  Vertigo.

CT HEAD WITHOUT CONTRAST
TECHNIQUE: Contiguous axial images were obtained from the base of
the skull through the vertex without contrast.

[Series 3: head bone · axial · 0.49mm/px · z∈[+5,+26]mm · 2 of 28 slices shown]
[im 2/28  bone]
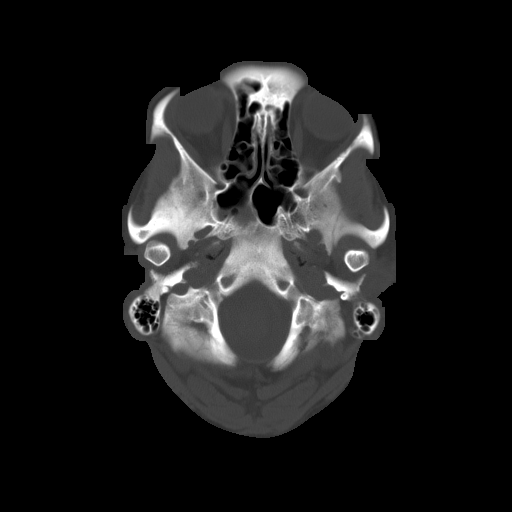
[im 6/28  bone]
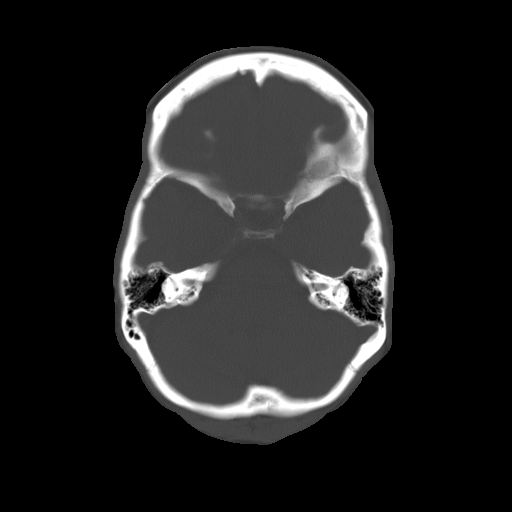

[Series 32: 3d filtered head w/o · axial · non-contrast · 0.49mm/px · z∈[+5,+129]mm · 13 of 28 slices shown, 17 images]
[im 2/28  brain]
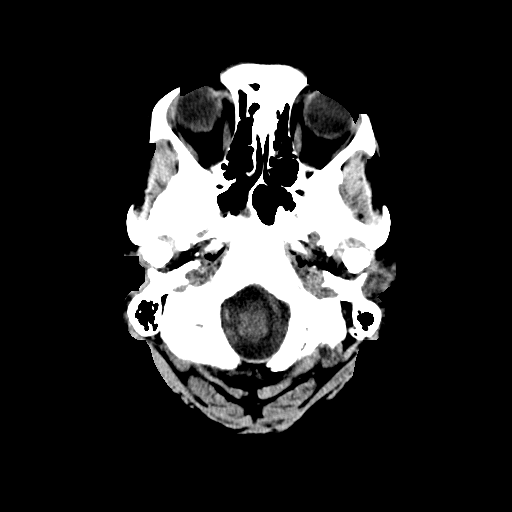
[im 2/28  bone]
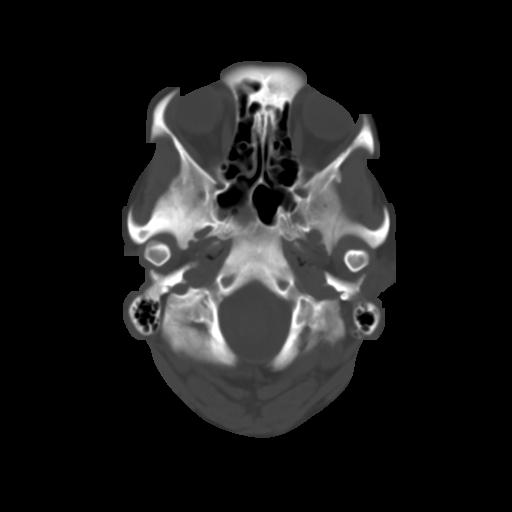
[im 4/28  brain]
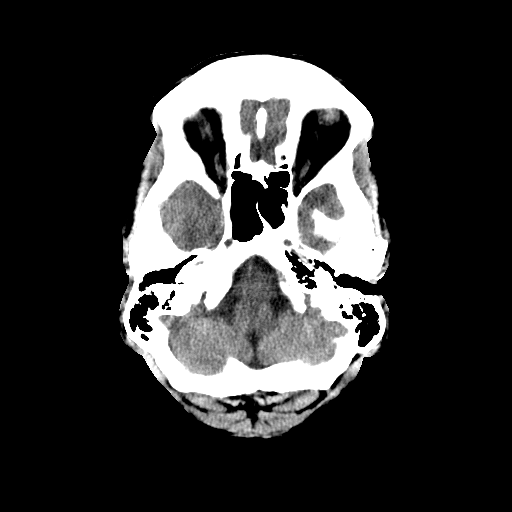
[im 6/28  brain]
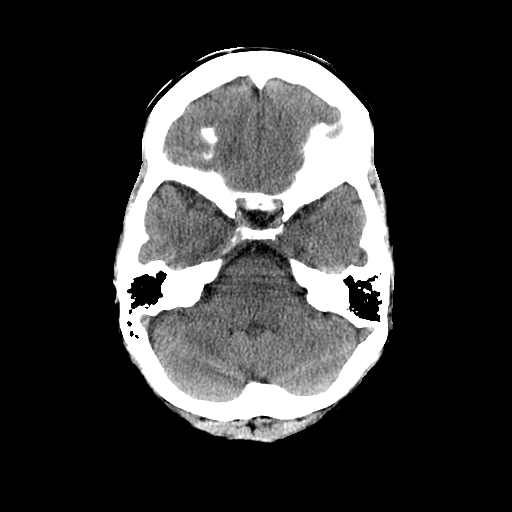
[im 8/28  brain]
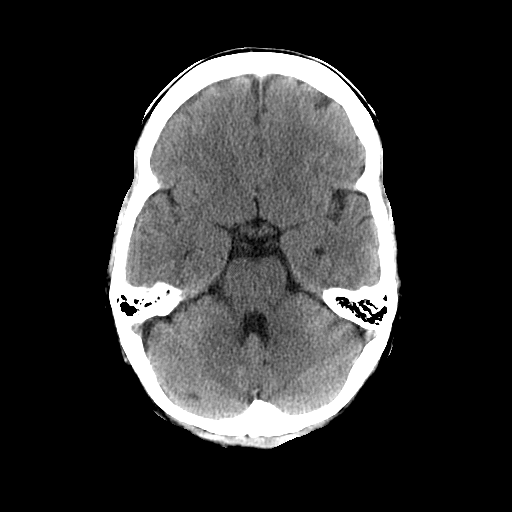
[im 10/28  brain]
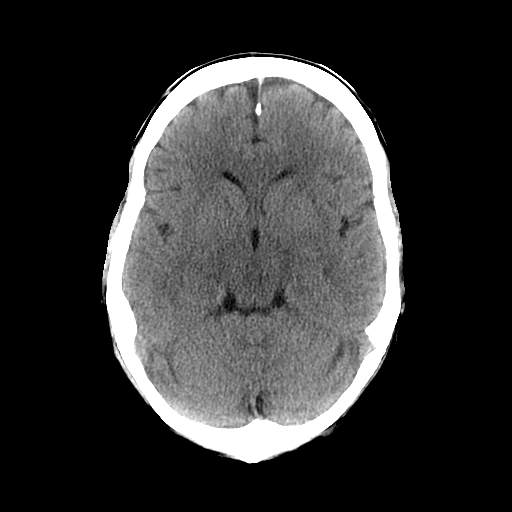
[im 10/28  bone]
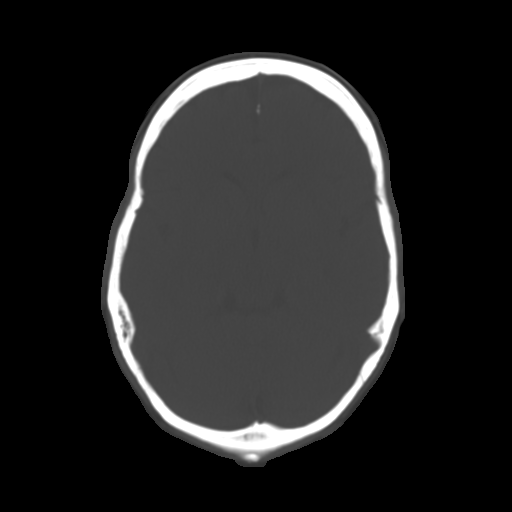
[im 12/28  brain]
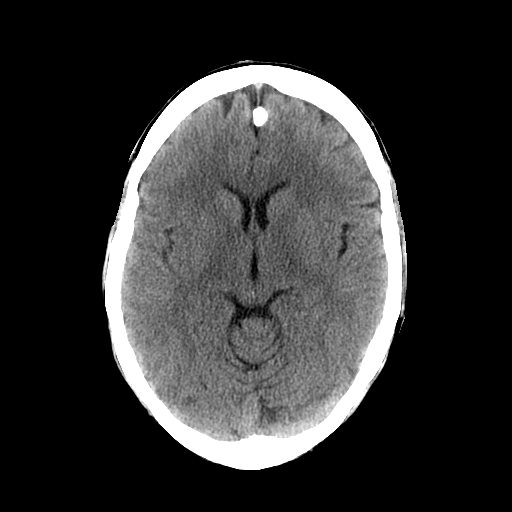
[im 14/28  brain]
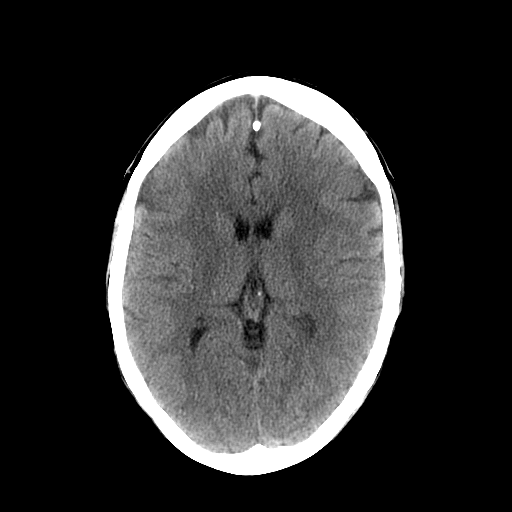
[im 16/28  brain]
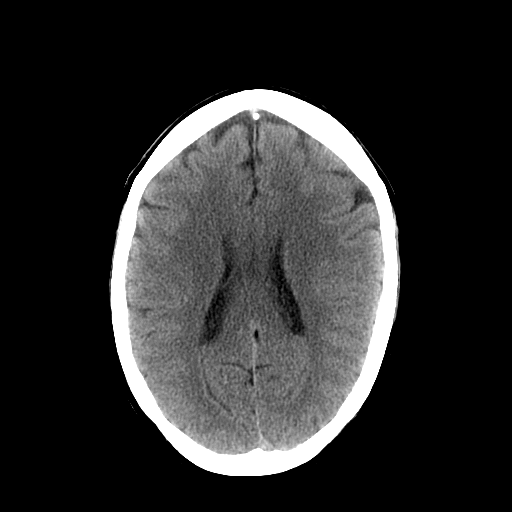
[im 18/28  brain]
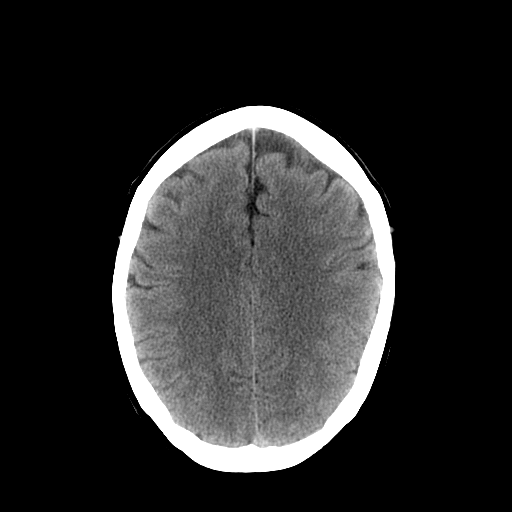
[im 18/28  bone]
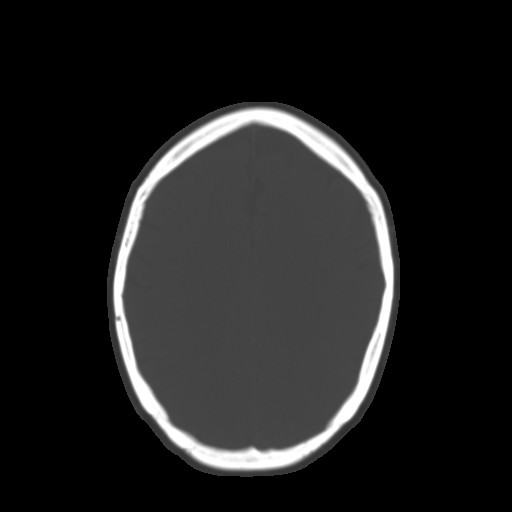
[im 20/28  brain]
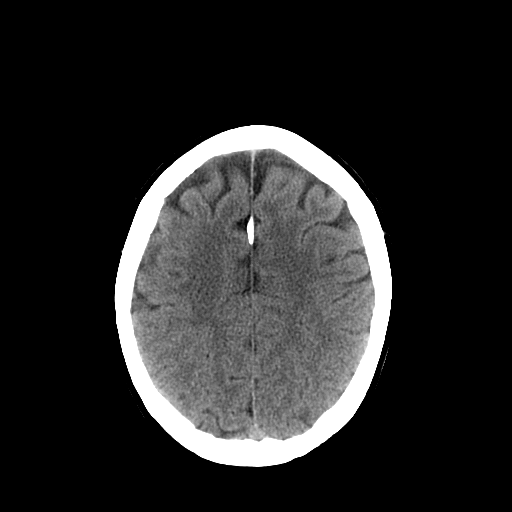
[im 22/28  brain]
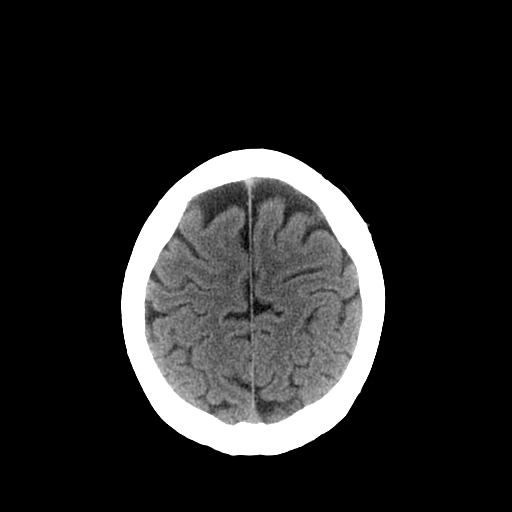
[im 24/28  brain]
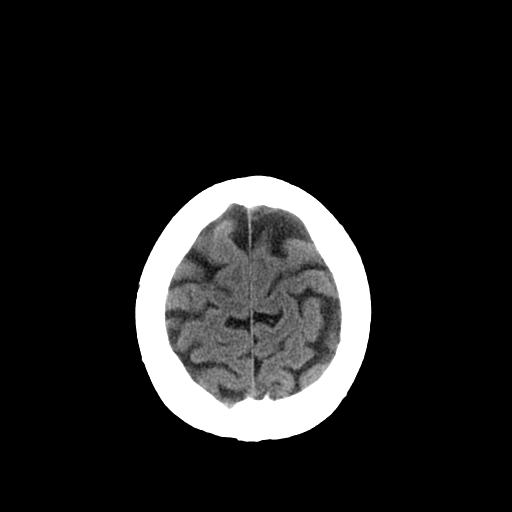
[im 26/28  brain]
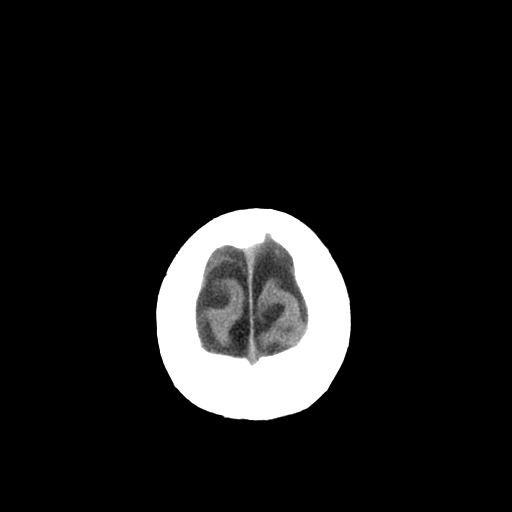
[im 26/28  bone]
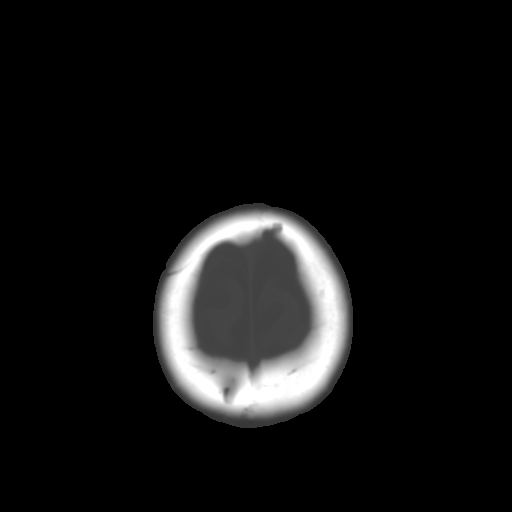

[15 of 30 positions shown; findings below may reference images not displayed]

FINDINGS: A sub centimeter area of hypoattenuation is noted within
the right cerebellar hemisphere posteriorly.  This likely
represents an age indeterminate lacunar infarct.  No other focal
infarct is present.  There is no hemorrhage or mass lesion.  The
ventricles are within normal limits for size.  No significant extra-
axial fluid collection is present.

Scattered ossification of the falx cerebri is unlikely be
consequence to the patient.

The paranasal sinuses and mastoid air cells are clear.  Incidental
note is made of asymmetric pneumatization of the left petrous apex.
IMPRESSION: 1.  Sub centimeter focus of hypoattenuation in the right cerebellum
and is most compatible with an age indeterminate lacunar infarct.
2.  Otherwise normal appearance the brain.

## 2014-11-23 ENCOUNTER — Encounter (HOSPITAL_COMMUNITY): Payer: Self-pay | Admitting: Emergency Medicine

## 2014-11-23 ENCOUNTER — Emergency Department (HOSPITAL_COMMUNITY)
Admission: EM | Admit: 2014-11-23 | Discharge: 2014-11-23 | Disposition: A | Discharge status: Home / Self Care | Payer: BLUE CROSS/BLUE SHIELD | Attending: Emergency Medicine | Admitting: Emergency Medicine

## 2014-11-23 DIAGNOSIS — Z9889 Other specified postprocedural states: Secondary | ICD-10-CM | POA: Insufficient documentation

## 2014-11-23 DIAGNOSIS — M545 Low back pain: Secondary | ICD-10-CM | POA: Insufficient documentation

## 2014-11-23 DIAGNOSIS — G8929 Other chronic pain: Secondary | ICD-10-CM | POA: Insufficient documentation

## 2014-11-23 DIAGNOSIS — M549 Dorsalgia, unspecified: Secondary | ICD-10-CM | POA: Diagnosis present

## 2014-11-23 HISTORY — DX: Personal history of other diseases of the musculoskeletal system and connective tissue: Z87.39

## 2014-11-23 MED ORDER — CYCLOBENZAPRINE HCL 10 MG PO TABS: 10.0000 mg | ORAL_TABLET | Freq: Two times a day (BID) | ORAL | Status: AC | PRN

## 2014-11-23 MED ORDER — KETOROLAC TROMETHAMINE 60 MG/2ML IM SOLN
60.0000 mg | Freq: Once | INTRAMUSCULAR | Status: AC
  Administered 2014-11-23: 60 mg via INTRAMUSCULAR
  Filled 2014-11-23: qty 2

## 2014-11-23 MED ORDER — DICLOFENAC SODIUM 50 MG PO TBEC: 50.0000 mg | DELAYED_RELEASE_TABLET | Freq: Two times a day (BID) | ORAL | Status: AC

## 2014-11-23 MED ORDER — OXYCODONE-ACETAMINOPHEN 5-325 MG PO TABS
1.0000 | ORAL_TABLET | Freq: Once | ORAL | Status: AC
Start: 2014-11-23 — End: 2014-11-23
  Administered 2014-11-23: 1 via ORAL
  Filled 2014-11-23: qty 1

## 2014-11-23 MED ORDER — OXYCODONE-ACETAMINOPHEN 5-325 MG PO TABS: 1.0000 | ORAL_TABLET | Freq: Four times a day (QID) | ORAL | Status: AC | PRN

## 2014-11-23 NOTE — ED Provider Notes (Signed)
CSN: [id number]     Arrival date & time 11/23/14  1330 History  This chart was scribed for non-physician practitioner Kerrie BuffaloHope Alyiah Ulloa, NP, working with Joya Gaskinsonald W Wickline, MD by Littie Deedsichard Sun, ED Scribe. This patient was seen in room WTR5/WTR5 and the patient's care was started at 2:41 PM.      Chief Complaint  Patient presents with  . Back Pain   Patient is a 49 y.o. female presenting with back pain. The history is provided by the patient. No language interpreter was used.  Back Pain Location:  Lumbar spine Radiates to:  L knee Pain severity:  Severe Onset quality:  Gradual Duration:  1 day Timing:  Constant Progression:  Unchanged Chronicity:  Chronic Context: not recent injury   Relieved by:  Nothing Ineffective treatments:  OTC medications Associated symptoms: no fever     HPI Comments: [name] is a 49 y.o. female with a hx of DDD and back surgery who presents to the Emergency Department complaining of gradual onset, severe lower back pain radiating down her left leg to her knee that started last night. She has tried Aleve but without relief. Patient states she has had similar pain before, but she states it has never been this severe. She reports allergies to codeine and Vicodin, but she states she is able to tolerate percocet. Patient denies recent injury.   Past Medical History  Diagnosis Date  . H/O degenerative disc disease    Past Surgical History  Procedure Laterality Date  . Back surgery    . Abdominal hysterectomy     No family history on file. History  Substance Use Topics  . Smoking status: Never Smoker   . Smokeless tobacco: Not on file  . Alcohol Use: No     Comment: social   OB History    No data available     Review of Systems  Constitutional: Negative for fever.  Musculoskeletal: Positive for back pain.  all other systems negative    Allergies  Codeine and Vicodin  Home Medications   Prior to Admission medications   Medication Sig Start Date  End Date Taking? Authorizing Provider  cyclobenzaprine (FLEXERIL) 10 MG tablet Take 1 tablet (10 mg total) by mouth 2 (two) times daily as needed for muscle spasms. 11/23/14   Lexiana Spindel Orlene OchM Caeleb Batalla, NP  diclofenac (VOLTAREN) 50 MG EC tablet Take 1 tablet (50 mg total) by mouth 2 (two) times daily. 11/23/14   Vedansh Kerstetter Orlene OchM Adesuwa Osgood, NP  oxyCODONE-acetaminophen (ROXICET) 5-325 MG per tablet Take 1 tablet by mouth every 6 (six) hours as needed for severe pain. 11/23/14   Mirela Parsley Orlene OchM Blanka Rockholt, NP   BP 138/87 mmHg  Pulse 75  Temp(Src) 98.2 F (36.8 C) (Oral)  Resp 18  SpO2 98% Physical Exam  Constitutional: She is oriented to person, place, and time. She appears well-developed and well-nourished. No distress.  HENT:  Head: Normocephalic and atraumatic.  Right Ear: Tympanic membrane normal.  Left Ear: Tympanic membrane normal.  Nose: Nose normal.  Mouth/Throat: Uvula is midline, oropharynx is clear and moist and mucous membranes are normal.  Eyes: EOM are normal.  Neck: Normal range of motion. Neck supple.  Cardiovascular: Normal rate and regular rhythm.   Pulmonary/Chest: Effort normal. She has no wheezes. She has no rales.  Abdominal: Soft. Bowel sounds are normal. There is no tenderness.  Musculoskeletal: Normal range of motion.       Lumbar back: She exhibits tenderness, pain and spasm. She exhibits normal pulse.  Back:  Pedal pulses equal, adequate circulation, good touch sensation.   Neurological: She is alert and oriented to person, place, and time. She has normal strength. No cranial nerve deficit or sensory deficit. Gait normal.  Reflex Scores:      Bicep reflexes are 2+ on the right side and 2+ on the left side.      Brachioradialis reflexes are 2+ on the right side and 2+ on the left side.      Patellar reflexes are 2+ on the right side and 2+ on the left side.      Achilles reflexes are 2+ on the right side and 2+ on the left side. Skin: Skin is warm and dry.  Psychiatric: She has a normal mood and  affect. Her behavior is normal.  Nursing note and vitals reviewed.   ED Course  Procedures  DIAGNOSTIC STUDIES: Oxygen Saturation is 98% on room air, normal by my interpretation.    COORDINATION OF CARE: 2:43 PM-Discussed treatment plan which includes pain medication with pt at bedside and pt agreed to plan. Will examine her once pain improves.   MDM  49 y.o. female with hx of low back pain with acute onset yesterday. Will treat for pain and inflammation and she will follow up with her PCP or ortho if symptoms persist. Stable for d/c without neuro deficits.Discussed with the patient and all questioned fully answered.    Final diagnoses:  Acute exacerbation of chronic low back pain   I personally performed the services described in this documentation, which was scribed in my presence. The recorded information has been reviewed and is accurate.    Belt, NP 11/26/14 1955  Joya Gaskins, MD 11/26/14 [phone]

## 2014-11-23 NOTE — ED Notes (Signed)
Pt from home c/o low back pain that shoots down right leg since yesterday. Hx of chronic low back pain. Denies recent injury. She reports taking aleve with out relief.
